# Patient Record
Sex: Male | Born: 1985 | Race: Black or African American | Hispanic: No | Marital: Single
Health system: Southern US, Community
[De-identification: ages and names within clinical notes are randomized; demographics above are authoritative.]

---

## 2019-10-09 ENCOUNTER — Other Ambulatory Visit: Payer: Self-pay

## 2019-10-09 ENCOUNTER — Emergency Department (HOSPITAL_COMMUNITY): Payer: Self-pay

## 2019-10-09 ENCOUNTER — Emergency Department (HOSPITAL_COMMUNITY)
Admission: EM | Admit: 2019-10-09 | Discharge: 2019-10-09 | Disposition: A | Discharge status: Home / Self Care | Payer: Self-pay | Attending: Emergency Medicine | Admitting: Emergency Medicine

## 2019-10-09 DIAGNOSIS — S42341A Displaced spiral fracture of shaft of humerus, right arm, initial encounter for closed fracture: Secondary | ICD-10-CM | POA: Insufficient documentation

## 2019-10-09 DIAGNOSIS — Y9302 Activity, running: Secondary | ICD-10-CM | POA: Insufficient documentation

## 2019-10-09 DIAGNOSIS — Y929 Unspecified place or not applicable: Secondary | ICD-10-CM | POA: Insufficient documentation

## 2019-10-09 DIAGNOSIS — W010XXA Fall on same level from slipping, tripping and stumbling without subsequent striking against object, initial encounter: Secondary | ICD-10-CM | POA: Insufficient documentation

## 2019-10-09 DIAGNOSIS — Y999 Unspecified external cause status: Secondary | ICD-10-CM | POA: Insufficient documentation

## 2019-10-09 MED ORDER — HYDROCODONE-ACETAMINOPHEN 5-325 MG PO TABS
1.0000 | ORAL_TABLET | Freq: Once | ORAL | Status: AC
  Administered 2019-10-09: 1 via ORAL
  Filled 2019-10-09: qty 1

## 2019-10-09 MED ORDER — HYDROCODONE-ACETAMINOPHEN 5-325 MG PO TABS: 2.0000 | ORAL_TABLET | ORAL | 0 refills | Status: AC | PRN

## 2019-10-09 MED ORDER — METHOCARBAMOL 500 MG PO TABS: 500.0000 mg | ORAL_TABLET | Freq: Two times a day (BID) | ORAL | 0 refills | Status: AC

## 2019-10-09 NOTE — Progress Notes (Signed)
Orthopedic Tech Progress Note Patient Details:  Alan Lozano [birth year] [id number]  Ortho Devices Type of Ortho Device: Sling immobilizer Ortho Device/Splint Location: right Ortho Device/Splint Interventions: Application   Post Interventions Patient Tolerated: Well Instructions Provided: Care of device   Saul Fordyce 10/09/2019, 3:03 PM

## 2019-10-09 NOTE — Discharge Instructions (Addendum)
Take Norco as needed as prescribed for pain. Take Robaxin as needed as prescribed for muscle spasm. Do not drive or operate machinery while taking these medications. These medications can cause constipation, take Colace and Miralax as needed.  You can take Motrin, up to 800mg  every 8 hours as needed for pain. Norco contains Tylenol, you can take Tylenol, 650mg  every 4-6 hours as needed for pain (take in place of your Norco dose), do not exceed 4g of Tylenol daily. Discuss with your pharmacist if you have any questions.   Follow up with orthopedics tomorrow morning at 8:30AM.

## 2019-10-09 NOTE — ED Provider Notes (Signed)
MSE was initiated and I personally evaluated the patient and placed orders (if any) at  2:02 PM on October 09, 2019.  34 yo male with right shoulder pain, patient was running in flip flops, tripped and landed on right arm, now with pain in right shoulder. No prior injuries/dislocations to this arm. Last drank water at 1230PM, has not eaten today.  Strong radial pulse present, sensation intact, limited ROM right shoulder due to pain, no obvious deformity, no pain with ROM right elbow/wrist.   The patient appears stable so that the remainder of the MSE may be completed by another provider.   Jeannie Fend, PA-C 10/09/19 1404    Gerhard Munch, MD 10/09/19 1505

## 2019-10-09 NOTE — ED Provider Notes (Signed)
[facility]-EMERGENCY DEPT Provider Note   CSN: [id number] Arrival date & time: 10/09/19  1301     History Chief Complaint  Patient presents with  . right shoulder injury    Alan Lozano is a 34 y.o. male.  34 yo male with right shoulder pain, patient was running in flip flops, tripped and landed on right arm, now with pain in right shoulder. No prior injuries/dislocations to this arm. Last drank water at 1230PM, has not eaten today.         No past medical history on file.  There are no problems to display for this patient.   No family history on file.  Social History   Tobacco Use  . Smoking status: Not on file  Substance Use Topics  . Alcohol use: Not on file  . Drug use: Not on file    Home Medications Prior to Admission medications   Medication Sig Start Date End Date Taking? Authorizing Provider  HYDROcodone-acetaminophen (NORCO/VICODIN) 5-325 MG tablet Take 2 tablets by mouth every 4 (four) hours as needed. 10/09/19   Jeannie Fend, PA-C  methocarbamol (ROBAXIN) 500 MG tablet Take 1 tablet (500 mg total) by mouth 2 (two) times daily. 10/09/19   Jeannie Fend, PA-C    Allergies    Patient has no known allergies.  Review of Systems   Review of Systems  Constitutional: Negative for fever.  Musculoskeletal: Positive for arthralgias. Negative for back pain, neck pain and neck stiffness.  Skin: Negative for rash and wound.  Neurological: Negative for weakness and numbness.    Physical Exam Updated Vital Signs BP (!) 132/91 (BP Location: Left Arm)   Pulse 79   Temp 98.9 F (37.2 C) (Oral)   Resp 18   Ht 5\' 8"  (1.727 m)   Wt 104.3 kg   SpO2 100%   BMI 34.97 kg/m   Physical Exam Vitals and nursing note reviewed.  Constitutional:      General: He is not in acute distress.    Appearance: He is well-developed. He is not diaphoretic.  HENT:     Head: Normocephalic and atraumatic.  Cardiovascular:     Pulses: Normal  pulses.  Pulmonary:     Effort: Pulmonary effort is normal.  Musculoskeletal:        General: Tenderness present. No deformity.     Right shoulder: Tenderness present. No deformity. Decreased range of motion. Normal pulse.     Comments: Strong radial pulse, sensation intact. Normal ROM wrist and elbow. Limited ROM right shoulder due to pain.   Skin:    General: Skin is warm and dry.     Findings: No erythema or rash.  Neurological:     Mental Status: He is alert and oriented to person, place, and time.     Sensory: No sensory deficit.  Psychiatric:        Behavior: Behavior normal.     ED Results / Procedures / Treatments   Labs (all labs ordered are listed, but only abnormal results are displayed) Labs Reviewed - No data to display  EKG None  Radiology DG Shoulder Right  Result Date: 10/09/2019 CLINICAL DATA:  Right shoulder pain due to an injury suffered in a trip and fall earlier today. Initial encounter. EXAM: RIGHT SHOULDER - 2+ VIEW COMPARISON:  None. FINDINGS: The patient has an acute spiral fracture of the proximal humerus which is better seen on dedicated plain films of the humerus today. The shoulder is located and  the acromioclavicular joint is intact. Imaged lung parenchyma and ribs appear normal. IMPRESSION: Acute proximal right humerus fracture is better seen on dedicated plain films of the right humerus today. The exam is otherwise negative. Electronically Signed   By: Drusilla Kanner M.D.   On: 10/09/2019 15:00   DG Humerus Right  Result Date: 10/09/2019 CLINICAL DATA:  Right upper arm pain due to an injury suffered in a trip and fall earlier today. Initial encounter. EXAM: RIGHT HUMERUS - 2+ VIEW COMPARISON:  None. FINDINGS: The patient has an acute fracture of the right humerus which appears to be spiral in orientation. The fracture extends from the inferior aspect of the greater tuberosity into the proximal diaphysis approximately 12 cm below the top of the  humeral head. The greater tuberosity and articular surface of the humerus do not appear disrupted. Fracture fragments are distracted up to 0.8 cm. IMPRESSION: Acute proximal humerus fracture as described above. Electronically Signed   By: Drusilla Kanner M.D.   On: 10/09/2019 15:02    Procedures Procedures (including critical care time)  Medications Ordered in ED Medications  HYDROcodone-acetaminophen (NORCO/VICODIN) 5-325 MG per tablet 1 tablet (1 tablet Oral Given 10/09/19 1457)    ED Course  I have reviewed the triage vital signs and the nursing notes.  Pertinent labs & imaging results that were available during my care of the patient were reviewed by me and considered in my medical decision making (see chart for details).  Clinical Course as of Oct 09 1514  Sun Oct 09, 2019  [id number] 34 year old male with right shoulder pain after mechanical fall onto the arm today.  Sensation intact, pulses present, has tenderness with palpation of right proximal humerus and range of motion right shoulder. X-ray shows no dislocation, spinal fracture proximal humerus.  Discussed with Dr. Jeraldine Loots, ER attending, plan to consult orthopedics anticipate discharge.  Patient was placed in a right shoulder immobilizer and given Norco for pain. Discussed with Dr. Eulah Pont with orthopedics, will see patient in the office at 830 tomorrow morning, agrees with plan for shoulder immobilizer. Patient given prescription for Norco and Robaxin.  Patient is aware of follow-up plan.   [LM]    Clinical Course User Index [LM] Alden Hipp   MDM Rules/Calculators/A&P                          Final Clinical Impression(s) / ED Diagnoses Final diagnoses:  Closed displaced spiral fracture of shaft of right humerus, initial encounter    Rx / DC Orders ED Discharge Orders         Ordered    methocarbamol (ROBAXIN) 500 MG tablet  2 times daily     Discontinue  Reprint     10/09/19 1453    HYDROcodone-acetaminophen  (NORCO/VICODIN) 5-325 MG tablet  Every 4 hours PRN     Discontinue  Reprint     10/09/19 1453           Jeannie Fend, PA-C 10/09/19 1516    Gerhard Munch, MD 10/10/19 [phone]

## 2019-10-09 NOTE — ED Triage Notes (Addendum)
Patient reports he was running in flip flops earlier and tripped injuring right shoulder. Patient says no pain if still but pain is 8/10 when moves arm. Denies every dislocating shoulder before.

## 2022-01-15 IMAGING — CR DG SHOULDER 2+V*R*
2 series · 2 of 2 positions shown · non-contrast
Comparison: None.

CLINICAL DATA: Right shoulder pain due to an injury suffered in a
trip and fall earlier today. Initial encounter.

EXAM:
RIGHT SHOULDER - 2+ VIEW

[w shoulder y-view right]
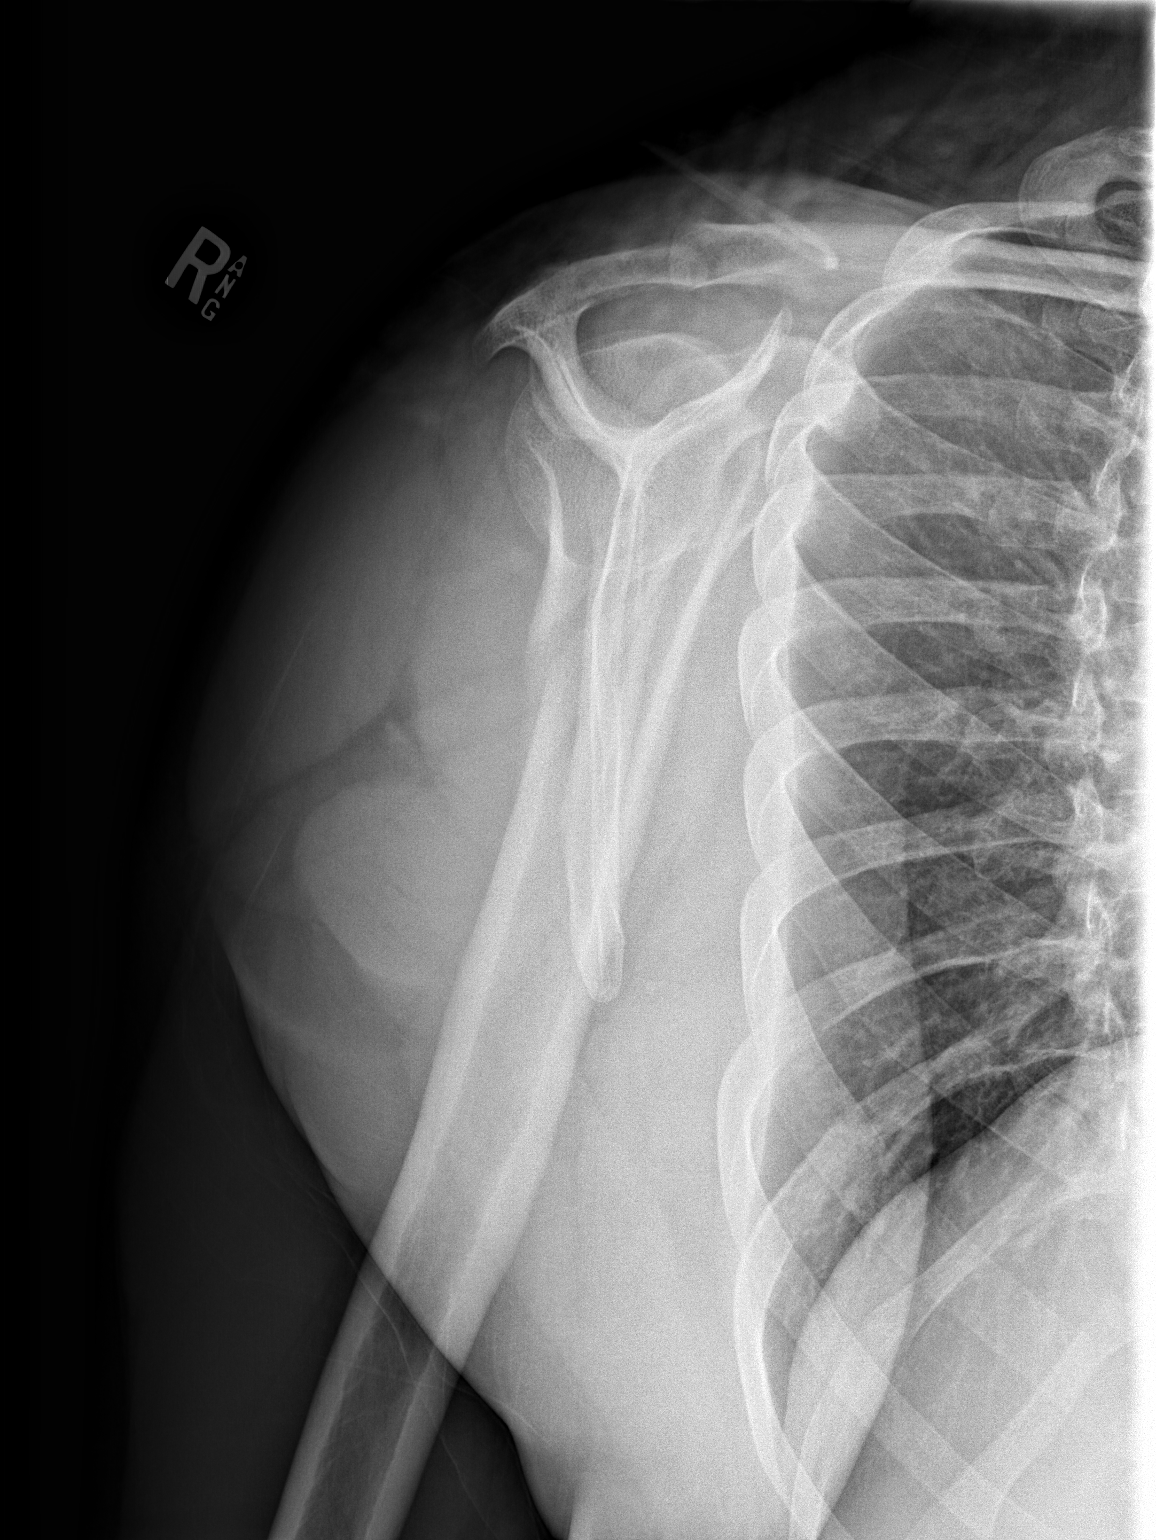

[w shoulder external right]
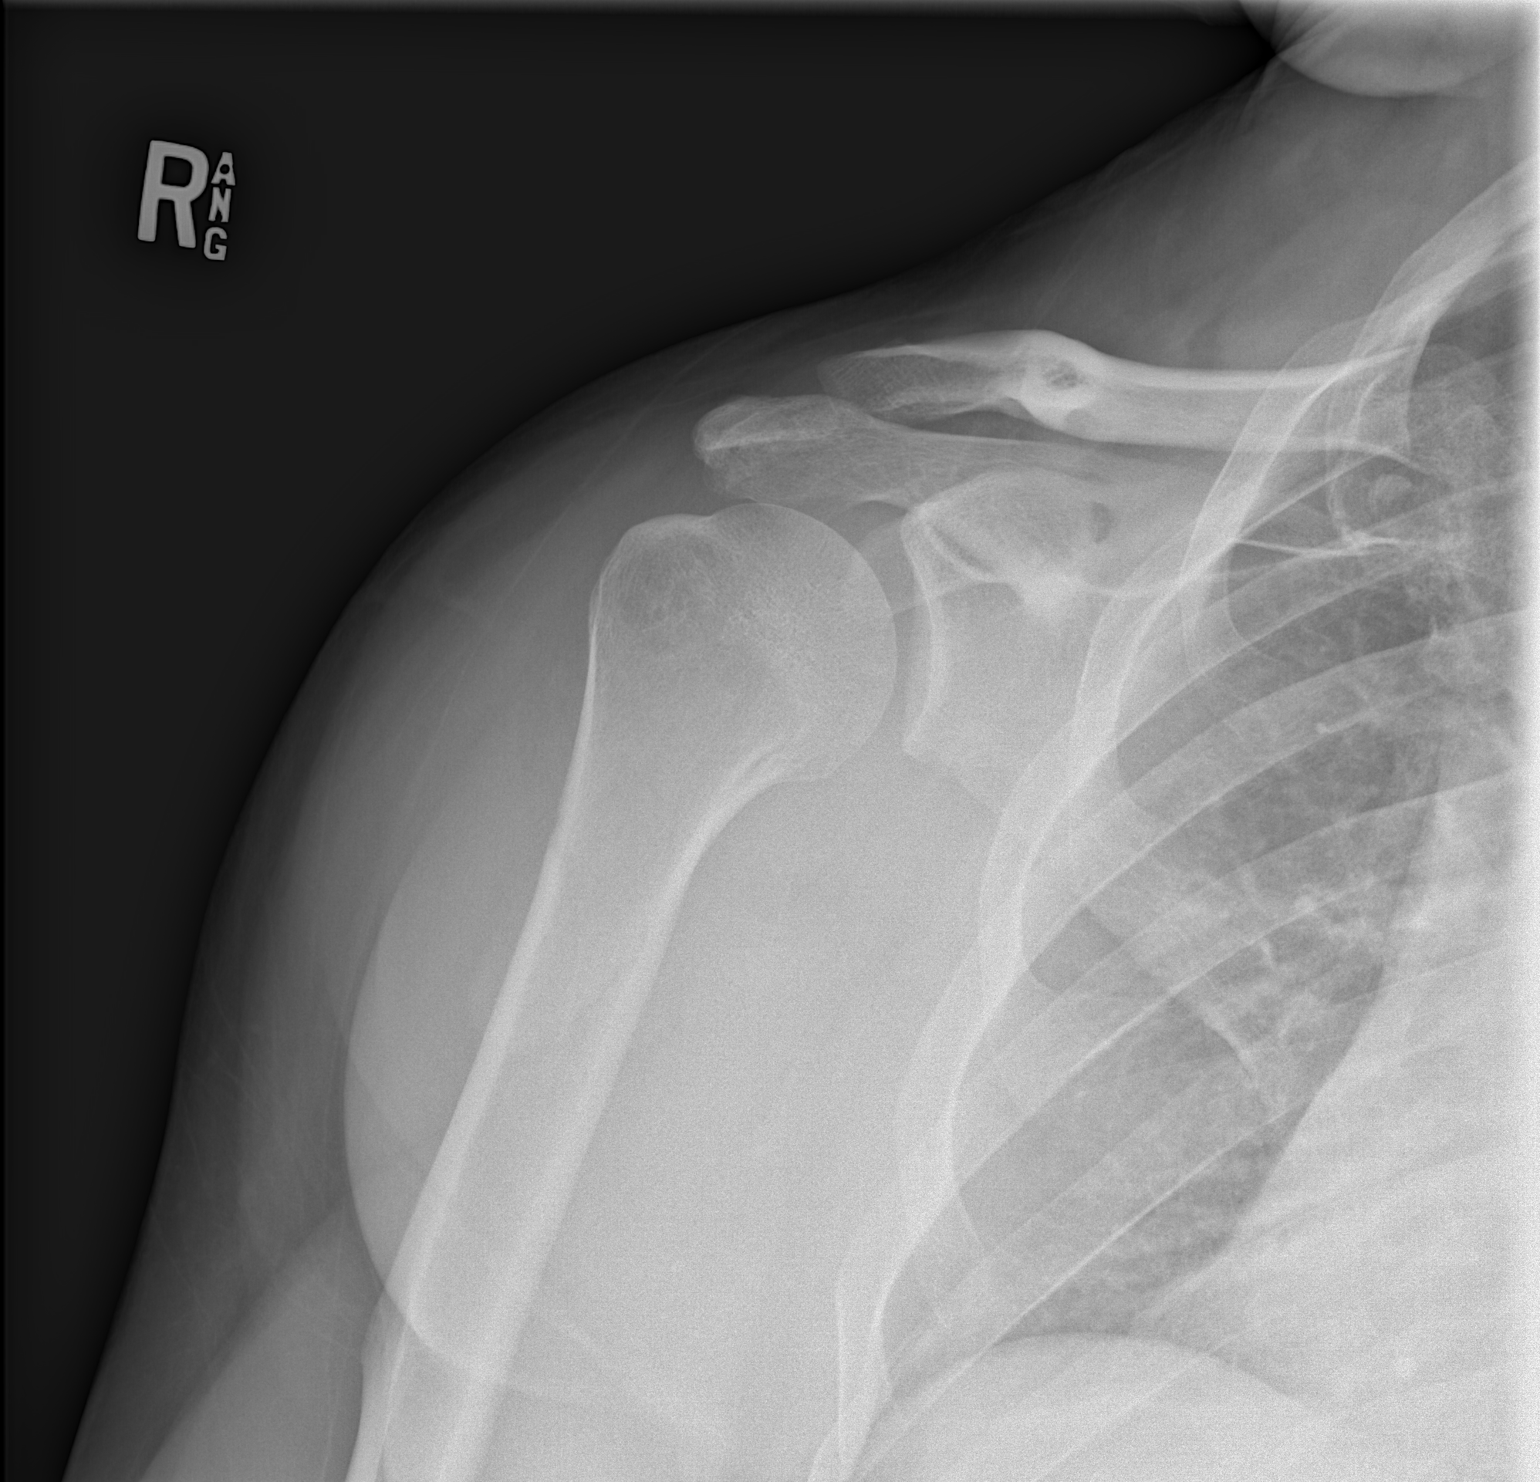

[2 of 2 positions shown; findings below may reference images not displayed]

FINDINGS: The patient has an acute spiral fracture of the proximal humerus
which is better seen on dedicated plain films of the humerus today.
The shoulder is located and the acromioclavicular joint is intact.
Imaged lung parenchyma and ribs appear normal.
IMPRESSION: Acute proximal right humerus fracture is better seen on dedicated
plain films of the right humerus today. The exam is otherwise
negative.

## 2022-01-15 IMAGING — CR DG HUMERUS 2V *R*
4 series · 4 of 4 positions shown · non-contrast
Comparison: None.

CLINICAL DATA: Right upper arm pain due to an injury suffered in a
trip and fall earlier today. Initial encounter.

EXAM:
RIGHT HUMERUS - 2+ VIEW

[w humerus ap right (1 of 2)]
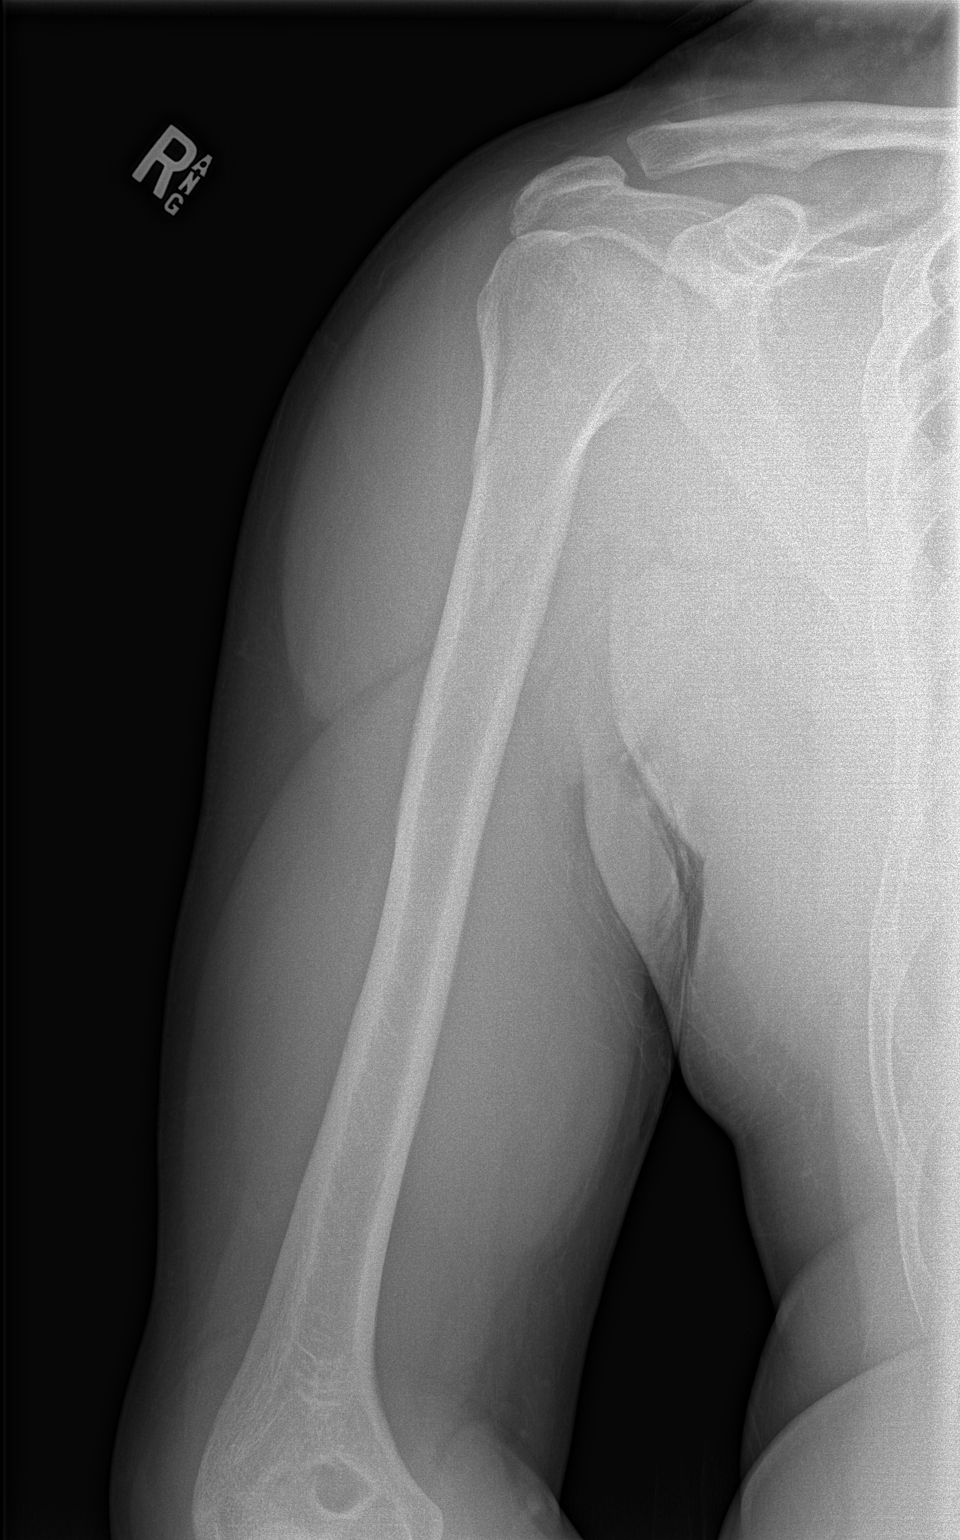

[w humerus ap right (2 of 2)]
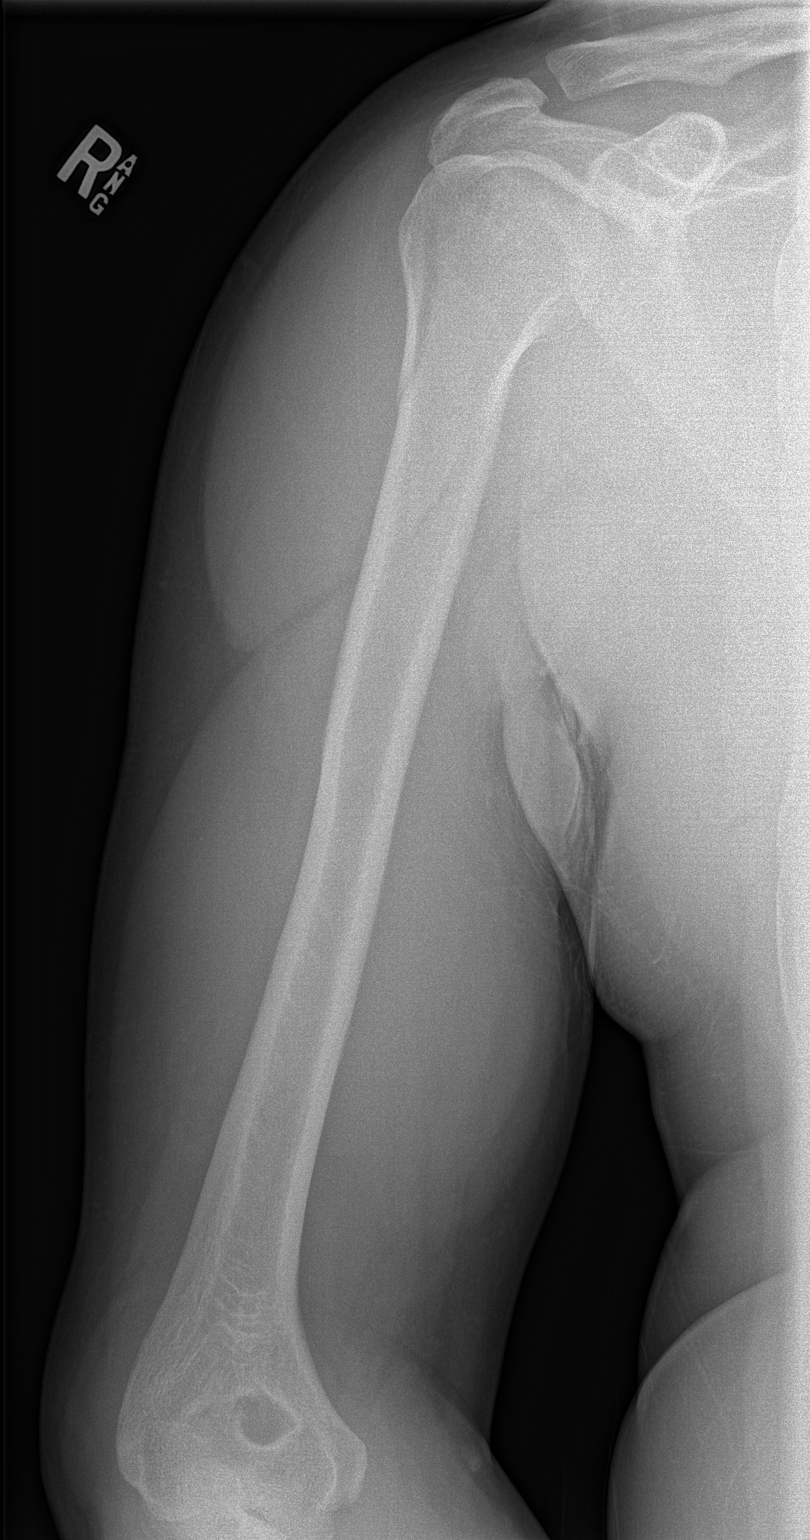

[w humerus lat right (1 of 2)]
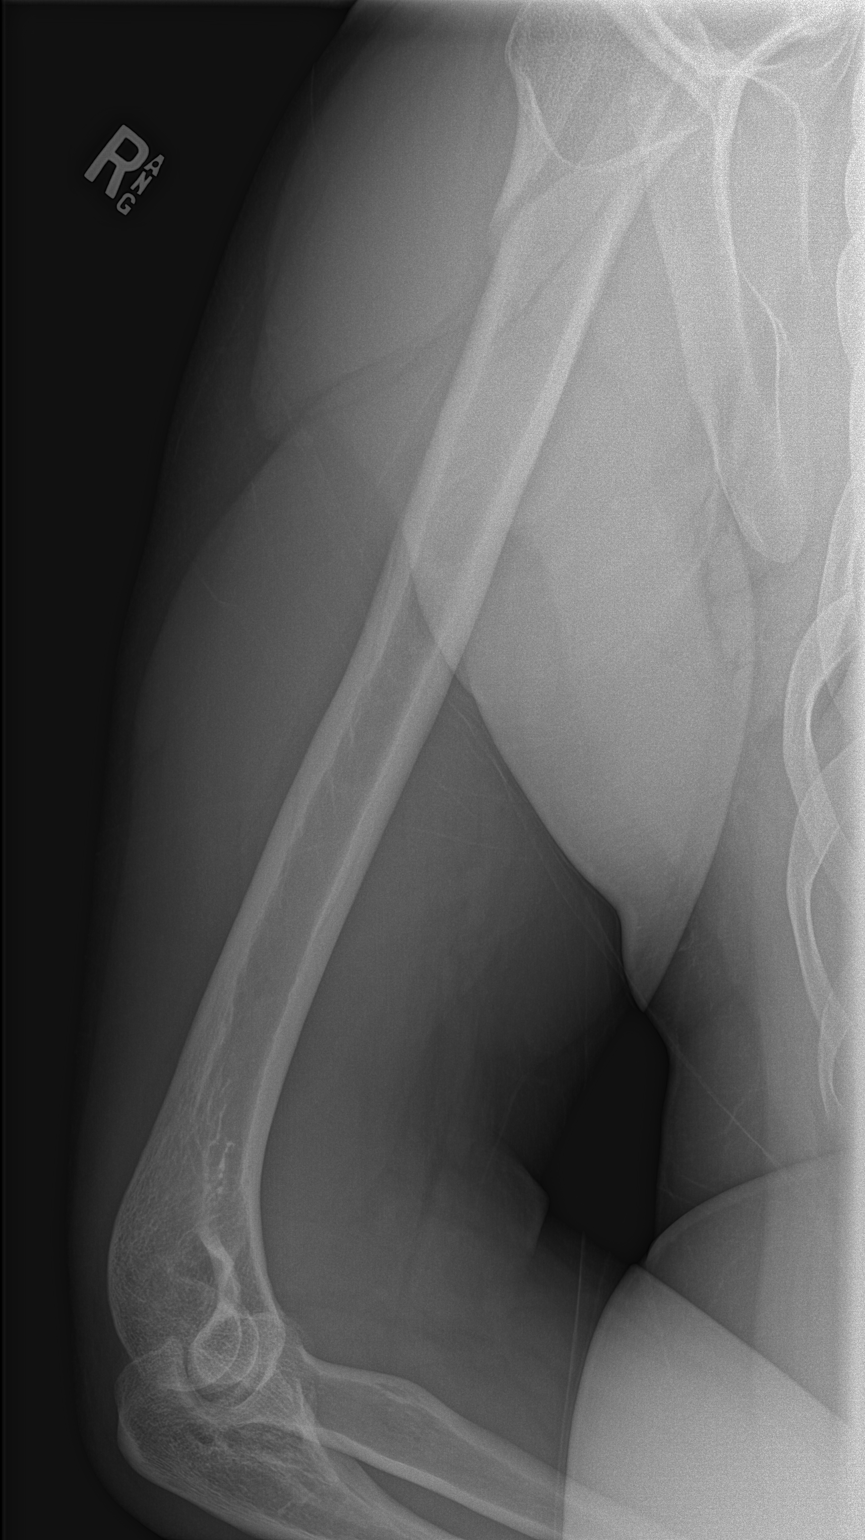

[w humerus lat right (2 of 2)]
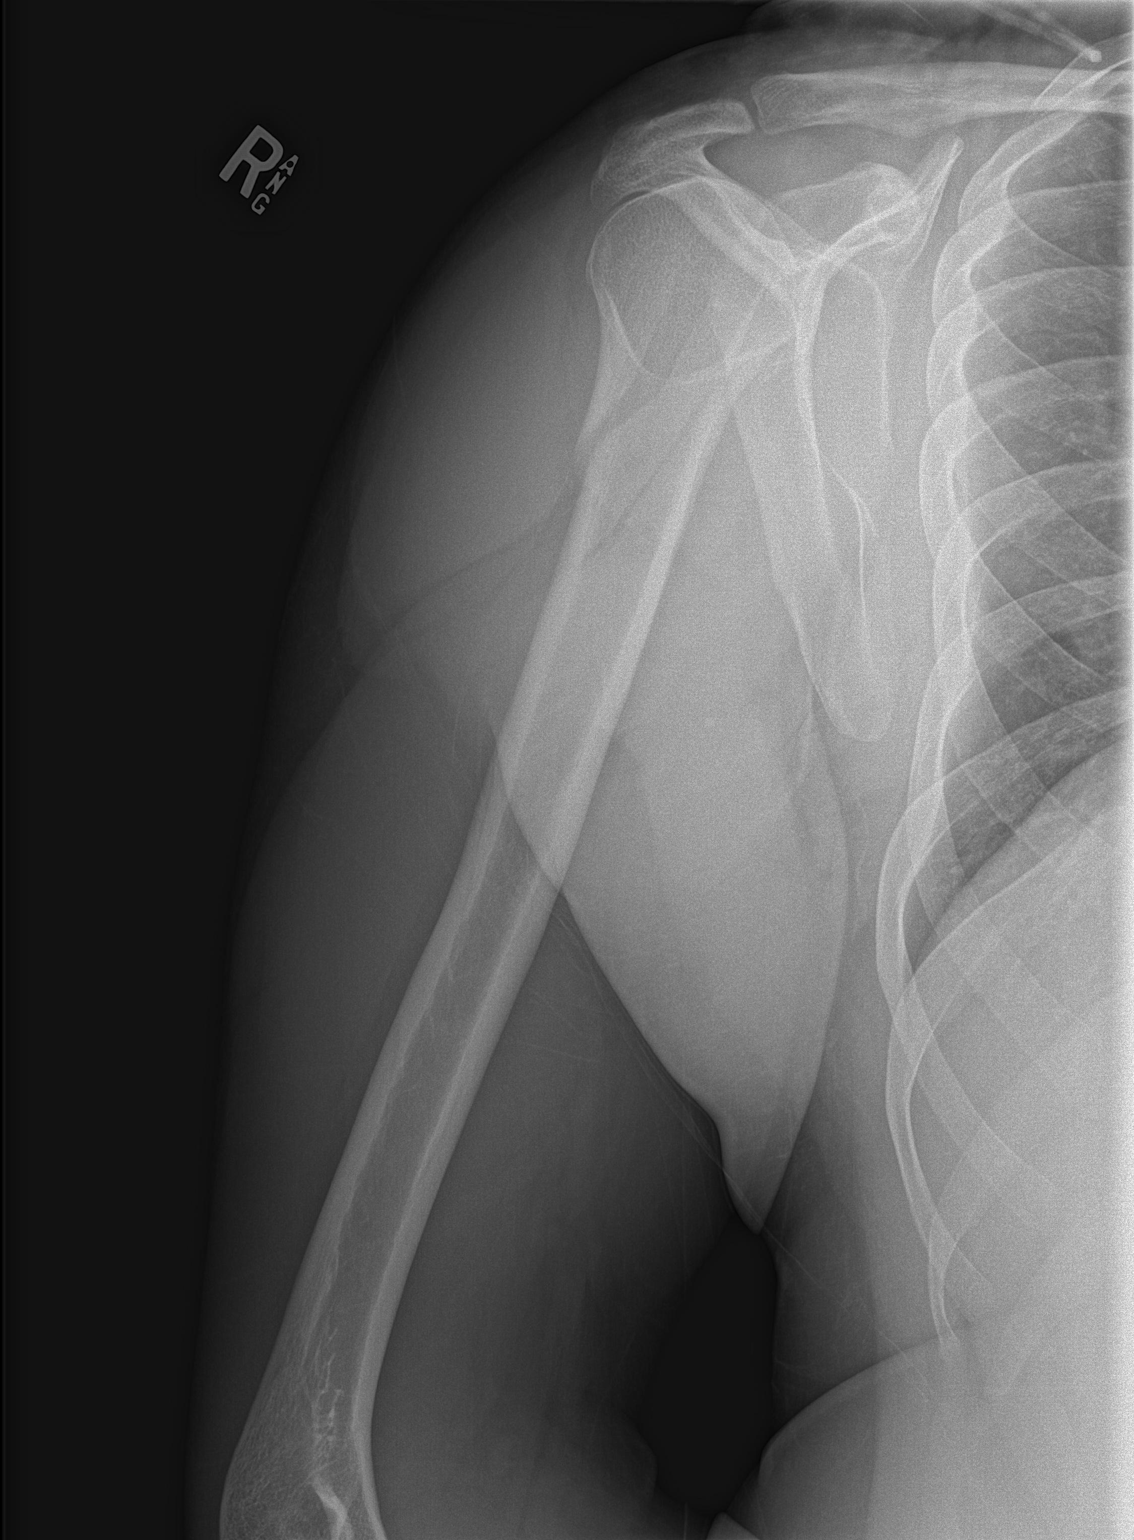

[4 of 4 positions shown; findings below may reference images not displayed]

FINDINGS: The patient has an acute fracture of the right humerus which appears
to be spiral in orientation. The fracture extends from the inferior
aspect of the greater tuberosity into the proximal diaphysis
approximately 12 cm below the top of the humeral head. The greater
tuberosity and articular surface of the humerus do not appear
disrupted. Fracture fragments are distracted up to 0.8 cm.
IMPRESSION: Acute proximal humerus fracture as described above.
